# Patient Record
Sex: Female | Born: 1965 | Race: White | Hispanic: No | Marital: Married | Smoking: Never smoker
Health system: Southern US, Community
[De-identification: ages and names within clinical notes are randomized; demographics above are authoritative.]

---

## 2001-08-30 ENCOUNTER — Other Ambulatory Visit: Admission: RE | Admit: 2001-08-30 | Discharge: 2001-08-30 | Payer: Self-pay | Admitting: Obstetrics and Gynecology

## 2002-10-30 ENCOUNTER — Other Ambulatory Visit: Admission: RE | Admit: 2002-10-30 | Discharge: 2002-10-30 | Payer: Self-pay | Admitting: Family Medicine

## 2003-10-26 ENCOUNTER — Emergency Department (HOSPITAL_COMMUNITY): Admission: EM | Admit: 2003-10-26 | Discharge: 2003-10-26 | Payer: Self-pay | Admitting: Emergency Medicine

## 2003-11-15 ENCOUNTER — Other Ambulatory Visit: Admission: RE | Admit: 2003-11-15 | Discharge: 2003-11-15 | Payer: Self-pay | Admitting: Obstetrics and Gynecology

## 2004-01-02 ENCOUNTER — Ambulatory Visit (HOSPITAL_COMMUNITY): Admission: RE | Admit: 2004-01-02 | Discharge: 2004-01-02 | Payer: Self-pay | Admitting: Obstetrics and Gynecology

## 2004-04-08 ENCOUNTER — Ambulatory Visit (HOSPITAL_COMMUNITY): Admission: RE | Admit: 2004-04-08 | Discharge: 2004-04-08 | Payer: Self-pay | Admitting: Obstetrics and Gynecology

## 2004-06-12 ENCOUNTER — Inpatient Hospital Stay (HOSPITAL_COMMUNITY): Admission: AD | Admit: 2004-06-12 | Discharge: 2004-06-16 | Payer: Self-pay | Admitting: Obstetrics and Gynecology

## 2004-06-14 ENCOUNTER — Encounter (INDEPENDENT_AMBULATORY_CARE_PROVIDER_SITE_OTHER): Payer: Self-pay | Admitting: Specialist

## 2005-03-25 ENCOUNTER — Other Ambulatory Visit: Admission: RE | Admit: 2005-03-25 | Discharge: 2005-03-25 | Payer: Self-pay | Admitting: Obstetrics and Gynecology

## 2006-04-29 ENCOUNTER — Other Ambulatory Visit: Admission: RE | Admit: 2006-04-29 | Discharge: 2006-04-29 | Payer: Self-pay | Admitting: Obstetrics and Gynecology

## 2009-09-16 ENCOUNTER — Ambulatory Visit (HOSPITAL_COMMUNITY): Admission: RE | Admit: 2009-09-16 | Discharge: 2009-09-16 | Payer: Self-pay | Admitting: Obstetrics and Gynecology

## 2010-09-17 ENCOUNTER — Ambulatory Visit (HOSPITAL_COMMUNITY)
Admission: RE | Admit: 2010-09-17 | Discharge: 2010-09-17 | Payer: Self-pay | Source: Home / Self Care | Attending: Obstetrics and Gynecology | Admitting: Obstetrics and Gynecology

## 2011-02-19 NOTE — Op Note (Signed)
NAME:  Suzanne Pittman, Suzanne Pittman                          ACCOUNT NO.:  [id number]   MEDICAL RECORD NO.:  [id number]                   PATIENT TYPE:  OUT   LOCATION:  ULT                                  [facility]   PHYSICIAN:  Hal Morales, M.D.             DATE OF BIRTH:  [birth year]   DATE OF PROCEDURE:  01/02/2004  DATE OF DISCHARGE:                                 OPERATIVE REPORT   PREOPERATIVE DIAGNOSES:  Intrauterine pregnancy at 17 weeks' gestation,  maternal age 16.   POSTOPERATIVE DIAGNOSES:  Intrauterine pregnancy at 28 weeks' gestation,  maternal age 73.   OPERATION:  Genetic amniocentesis.   ANESTHESIA:  Local.   ESTIMATED BLOOD LOSS:  Less than 5 mL.   COMPLICATIONS:  None.   FINDINGS:  The fetal measurements were consistent with 16 weeks with a  normal anatomy scan as could be determined at 16 weeks.  The amniotic fluid  volume was normal.   PROCEDURE:  The patient was placed in the supine position on the ultrasound  table and an area of fluid which was clear of umbilical cord and fetus was  identified in the midline suprapubically.  This area was marked and the area  cleansed with multiple layers of Betadine and then draped as a sterile  field.  The area was infiltrated with 1% Xylocaine and a 22-gauge Ultravue  needle used to access the amniotic fluid pocket under ultrasound guidance; 5  cc of clear fluid was drawn into the first syringe and 10 cc into the second  syringe.  The amnio site was documented on ultrasound and the needle  removed.  The post amniocentesis heart rate was in the 150s.  The fluid was  then sent to [facility] for evaluation.  The patient is  known to be RH positive.  She was given written instructions for post  amniocentesis care.  She will follow up in two weeks in the office at which  time she will receive her results.                                               Hal Morales, M.D.    VPH/MEDQ  D:   01/02/2004  T:  01/03/2004  Job:  [id number]

## 2011-02-19 NOTE — H&P (Signed)
NAME:  Suzanne Pittman, Suzanne Pittman                          ACCOUNT NO.:  [id number]   MEDICAL RECORD NO.:  [id number]                   PATIENT TYPE:  MAT   LOCATION:  MATC                                 [facility]   PHYSICIAN:  Naima A. Dillard, M.D.              DATE OF BIRTH:  [birth year]   DATE OF ADMISSION:  06/12/2004  DATE OF DISCHARGE:                                HISTORY & PHYSICAL   BRIEF HISTORY:  Ms. Pogue is a 45 year old married white female gravida 1  para 0 at 39-4/7 weeks who was sent over from the office for evaluation  secondary to having 2+ protein on a voided specimen.  She denies any  headache, visual disturbances, leaking or bleeding.  She had no history of  elevated blood pressures in the past and at the office visit today her blood  pressure was 110/60.  She also denies any nausea or vomiting or right upper  quadrant pain.  She reports positive fetal movement.  She reports mild  cramping but no strong contractions.  Her pregnancy has been followed at  [facility] by the certified nurse midwife service laterally has  been essentially uncomplicated though at risk for (1) advanced maternal age  with a normal amnio, (2) history of hyperemesis with this pregnancy and (3)  history of depression requiring Zoloft throughout this pregnancy.   OBSTETRICAL-GYNECOLOGICAL HISTORY:  She is a gravida 1 para 0 with an LMP of  September 04, 2003 giving her an [facility] of June 10, 2004 and by ultrasound  June 15, 2004.  She had a history of abnormal Pap smear in 1995 with a  normal colposcopy.   GENERAL MEDICAL HISTORY:  She has no known drug allergies.  She reports  having had the usual childhood diseases.  She reports a history of  depression, has been on Zoloft with this pregnancy, and fractured her right  arm at age 89.   FAMILY HISTORY:  Paternal grandmother with chronic hypertension.  Father and  paternal grandfather with type 2 diabetes.  Paternal  grandfather with  prostate cancer.   GENETIC HISTORY:  Genetic history is significant only for the fact that she  was over age 33 but she did have a normal amnio.   SOCIAL HISTORY:  She is married to Cathe Mons who is involved and  supportive.  They are both employed as veterinarians and they deny any  religious affiliation that affects their care.   PRENATAL LABORATORIES:  Her blood type is A positive, her antibody screen is  negative, syphilis is nonreactive, rubella is positive, hepatitis B surface  antigen, HIV was declined, toxo titers were negative, GC and Chlamydia were  negative, Pap smear was within normal limits, her 1-hour glucola was 113,  and her 36 week beta strep was negative.   PHYSICAL EXAMINATION:  Her blood pressures are 140s-150s over mostly 80s-  90s.  She is  afebrile and her heart is regular rhythm and rate.  Her chest  is clear.  Her breasts are soft and nontender.  Her abdomen is gravid with  uterine contractions at every 2-5 minutes and mild.  Fetal heart rate is  very reactive and reassuring.  Her pelvic exam is 2 cm, 80%, vertex, at a -1  to 0 station.  Her extremities have 2+ edema, 2+ reflexes, and no clonus.   LABORATORIES:  Her cath urine showed 1+ protein, specific gravity of greater  than 1.030 and ketones of 15, her platelets are 151,000, hemoglobin 11.4,  hematocrit of 33.3, SGOT of 21, SGPT of 12, creatinine of 0.9, LDH of 154,  and uric acid of 6.9.   ASSESSMENT:  1.  Intrauterine pregnancy at 39-4/7 weeks.  2.  Pregnancy-induced hypertension versus preeclampsia.   PLAN:  Her plan per consult with Dr. Normand Sloop was as follows.  Dr. Normand Sloop  recommends induction of labor however, the patient and her husband hesitate  to proceed as they do not want to have interventions that may not be  necessary.  They both would like to have more time to continue to assess  blood pressures and the necessity of induction prior to being induced  however, if the  blood pressures continue to rise throughout the night they  understand that induction of labor will need to be expedited and they are  willing to proceed in that case, otherwise, they would like to wait until  the morning, have a repeat urine evaluated and if the proteinuria is  persistent then proceed with induction.  In the meantime, a 24-hour urine  will be collected.     Concha Pyo. Duplantis, C.N.M.              Naima A. Normand Sloop, M.D.    SJD/MEDQ  D:  06/12/2004  T:  06/12/2004  Job:  [id number]

## 2011-02-19 NOTE — Discharge Summary (Signed)
Suzanne Pittman, Suzanne Pittman                          ACCOUNT NO.:  [id number]   MEDICAL RECORD NO.:  [id number]                   PATIENT TYPE:  INP   LOCATION:  9106                                 [facility]ILITY:  [facility]   PHYSICIAN:  Janine Limbo, M.D.            DATE OF BIRTH:  1965-11-03   DATE OF ADMISSION:  06/12/2004  DATE OF DISCHARGE:  06/16/2004                                 DISCHARGE SUMMARY   ADMITTING DIAGNOSES:  1.  Intrauterine pregnancy at term.  2.  Mild preeclampsia.   DISCHARGE DIAGNOSES:  1.  Intrauterine pregnancy at term.  2.  Pregnancy-induced hypertension.  3.  Retained placenta.  4.  Postpartum anemia.   PROCEDURES:  1.  Vacuum-assisted vaginal birth.  2.  Manual removal of placenta.   HOSPITAL COURSE:  Ms. Ruehl is a 45 year old gravida 1 para 0 at 39-4/7  weeks who was admitted on 06/12/04 with proteinuria on a voided and cathetered  specimen.  Pressure at that time was 110/60.  Pregnancy had been remarkable  for:  1. Advanced maternal age with normal amniocentesis.  2. History of  hyperemesis with pregnancy.  3. History of depression requiring Zoloft.  On  admission, cervix was 2, 80% vertex, -1 to 0 station.  Uric acid was 6.9,  otherwise PIH labs were within normal limits.  Induction of labor was  recommended in light of the elevated blood pressures in the 80-90s diastolic  and the 1+ protein on a cathetered specimen; however, the patient and  husband elected to observe overnight.  Pressures still were fairly labile  during the night.  Negative protein was repeated on a UA the next morning.  Further discussion was held with the patient and her husband.  The decision  was made to artificially rupture membranes with clear fluid noted for  initial measure of induction.  Labor progressed along.  The epidural was  placed.  Blood pressures remained somewhat labile.  She progressed to  completely dilated at approximately 7:50 p.m. on the evening of 06/13/04  with  a vertex of +1 station.  She also began to push at approximately 9:40.  She  pushed for approximately 1 hour to an hour and a half with onset of some  deep variables.  In light of the maternal exhaustion, the patient requested  assistance with the vacuum assistant vacuum extractor.  Dr. Normand Sloop was  consulted and she came and placed the Mityvac.  With one pull, the infant  was delivered.  There was no nuchal cord noted.  It was a viable female,  weight 6 pounds, 10 ounces, Apgars were 8 and 9.  Placenta remained  retained.  The patient given subcutaneous terbutaline with attempted manual  removal by Dr. Normand Sloop of the placenta without success.  The patient was  consented for D&E.  She was taken to the OR where sublingual nitroglycerin  was given.  This removed without difficulty.  Bedside ultrasound indicated  no retained products.  The patient tolerated the procedure well.  On  postpartum day #1, her hemoglobin was 7.5 down from 11.5.  White blood cell  count was 20.6 up from 8.8 and platelets were 136 down from 151.  She had  received one dose of Ancef in the recovery room.  She was very tired but was  having no syncope.  Breast-feeding was going well.  CBC was repeated on the  morning of 06/16/04 with hemoglobin of 6.8, white blood cell count of 16.1,  and platelet count of 147.  The patient was out ad lib without difficulty  and without syncope.  She declined transfusion.  She was begun on OTC iron  supplementation.  She was deemed to have received the full benefit of her  hospital stay and was discharged home.   DISCHARGE INSTRUCTIONS:  Per [facility] handout.   DISCHARGE MEDICATIONS:  1.  Motrin 600 mg p.o. q.6h. p.r.n.  2.  Tylox 1-2 p.o. q.3-4h. p.r.n. pain.  3.  The patient will take ferrous sulfate 324 mg one p.o. b.i.d.  4.  Micronor one p.o. daily.   DISCHARGE FOLLOWUP:  Will occur in 4-6 weeks at [facility]  Preeclampsia and PIH precautions were  discussed with the patient.  She will  follow up with [place] on an as-needed basis.   DISCHARGE CONDITION:  Stable.     Renaldo Reel Emilee Hero, C.N.M.                   Janine Limbo, M.D.    VLL/MEDQ  D:  06/16/2004  T:  06/16/2004  Job:  [id number]

## 2011-02-19 NOTE — Op Note (Signed)
NAME:  Suzanne Pittman, Suzanne Pittman                          ACCOUNT NO.:  [id number]   MEDICAL RECORD NO.:  [id number]                   PATIENT TYPE:  INP   LOCATION:  9106                                 [facility]   PHYSICIAN:  Naima A. Dillard, M.D.              DATE OF BIRTH:  1966-06-22   DATE OF PROCEDURE:  06/14/2004  DATE OF DISCHARGE:                                 OPERATIVE REPORT   DELIVERY NOTE:  I was called to see the patient secondary to moderate  variables to 60s to 80s, lasting for about 1 minute with good recovery.  Patient had positive maternal nausea.  The patient was then examined and  found to be +3 station and in the right occiput-anterior position.  Patient  and husband were counseled about vacuum-assisted vaginal delivery versus  cesarean section, told the risks of vacuum-assisted vaginal delivery to be,  but not limited to, intraventricular hemorrhage, cephalohematoma, scalp  abrasion, bleeding and infection, told the risks of C-section are, but not  limited to, bleeding, infection, damage to internal organs, endometrial  blood vessels.  Patient and husband choose to have a vacuum-assisted vaginal  delivery.  The patient was also given the option to continue pushing so any  help, but she said that she was definitely exhausted, so the Mityvac was  placed in correct position and with 1 pull and no pop-off at 500 mmHg, the  head delivered to perineum.  The vacuum was on the head for about 10-20  seconds.  Vacuum was removed.  There was no nuchal cord.  Body was delivered  without difficulty with Apgars of 8 and 9 and a weight of 6 pounds 10  ounces.  Placenta was retrained for 30 minutes.  The patient was given subcu  terbutaline and I attempted a manual removal of placenta without success.  The patient was counseled for a D&E.  She was told the risks were, but not  limited to, bleeding, infection, __________, hysterectomy and __________  increta, all reviewed with the  patient and patient consented for D&E and  removal in the OR.                                               Naima A. Normand Sloop, M.D.    NAD/MEDQ  D:  06/14/2004  T:  06/14/2004  Job:  [id number]

## 2011-02-19 NOTE — Op Note (Signed)
NAME:  Suzanne Pittman, Suzanne Pittman                          ACCOUNT NO.:  [id number]   MEDICAL RECORD NO.:  [id number]                   PATIENT TYPE:  INP   LOCATION:  9106                                 [facility]   PHYSICIAN:  Naima A. Dillard, M.D.              DATE OF BIRTH:  Feb 23, 1966   DATE OF PROCEDURE:  06/14/2004  DATE OF DISCHARGE:                                 OPERATIVE REPORT   PREOPERATIVE DIAGNOSIS:  Status post vaginal delivery with pregnancy-induced  hypertension with retained placenta.   POSTOPERATIVE DIAGNOSIS:  Status post vaginal delivery with pregnancy-  induced hypertension with retained placenta.   PROCEDURE:  Manual removal of placenta.   ANESTHESIA:  Epidural.   INTRAVENOUS FLUIDS:  2000 mL crystalloid.   ESTIMATED BLOOD LOSS:  400 mL.   COMPLICATIONS:  None.   FINDINGS:  Placenta was removed intact after sublingual nitroglycerin was  given at 400 mcg.  In each spray, the patient received six sprays from  anesthesia.  Her uterus was then found to relax and also after given another  epidural injection she was found to relax.  I was able to reach all the way  around the placenta and remove it intact.  There was an ultrasonographer at  the bedside and we noted that there was no retained placenta after removal.  The patient did not need a curette.  All instruments were removed from the  vagina.  Sponge, lap, and needle counts were correct x2.  The patient went  to recovery room in stable condition.                                               Naima A. Normand Sloop, M.D.    NAD/MEDQ  D:  06/14/2004  T:  06/14/2004  Job:  [id number]

## 2011-08-10 ENCOUNTER — Other Ambulatory Visit (HOSPITAL_COMMUNITY): Payer: Self-pay | Admitting: Obstetrics and Gynecology

## 2011-08-10 DIAGNOSIS — Z1231 Encounter for screening mammogram for malignant neoplasm of breast: Secondary | ICD-10-CM

## 2011-09-20 ENCOUNTER — Ambulatory Visit (HOSPITAL_COMMUNITY)
Admission: RE | Admit: 2011-09-20 | Discharge: 2011-09-20 | Disposition: A | Discharge status: Home / Self Care | Payer: PRIVATE HEALTH INSURANCE | Source: Ambulatory Visit | Attending: Obstetrics and Gynecology | Admitting: Obstetrics and Gynecology

## 2011-09-20 DIAGNOSIS — Z1231 Encounter for screening mammogram for malignant neoplasm of breast: Secondary | ICD-10-CM | POA: Insufficient documentation

## 2011-12-21 ENCOUNTER — Encounter: Payer: Self-pay | Admitting: Obstetrics and Gynecology

## 2011-12-23 ENCOUNTER — Encounter (INDEPENDENT_AMBULATORY_CARE_PROVIDER_SITE_OTHER): Payer: PRIVATE HEALTH INSURANCE | Admitting: Obstetrics and Gynecology

## 2011-12-23 DIAGNOSIS — Z30431 Encounter for routine checking of intrauterine contraceptive device: Secondary | ICD-10-CM

## 2012-08-17 ENCOUNTER — Ambulatory Visit: Payer: PRIVATE HEALTH INSURANCE | Admitting: Obstetrics and Gynecology

## 2012-09-12 ENCOUNTER — Ambulatory Visit (INDEPENDENT_AMBULATORY_CARE_PROVIDER_SITE_OTHER): Payer: 59 | Admitting: Obstetrics and Gynecology

## 2012-09-12 ENCOUNTER — Encounter: Payer: Self-pay | Admitting: Obstetrics and Gynecology

## 2012-09-12 VITALS — BP 104/58 | Ht 68.0 in | Wt 149.0 lb

## 2012-09-12 DIAGNOSIS — Z01419 Encounter for gynecological examination (general) (routine) without abnormal findings: Secondary | ICD-10-CM

## 2012-09-12 DIAGNOSIS — F3289 Other specified depressive episodes: Secondary | ICD-10-CM

## 2012-09-12 DIAGNOSIS — F329 Major depressive disorder, single episode, unspecified: Secondary | ICD-10-CM

## 2012-09-12 DIAGNOSIS — Z975 Presence of (intrauterine) contraceptive device: Secondary | ICD-10-CM

## 2012-09-12 NOTE — Progress Notes (Signed)
Subjective:    Suzanne Pittman is a 46 y.o. female, No obstetric history on file., who presents for an annual exam.   Patient reports:  Doing well, with new IUD placed 1/13.  In 3-4 months after that, had recurrent yeast sx, but now resolved.  No cycles on Mirena, but has some sense of ovulation.    History   Social History  . Marital Status: Married    Spouse Name: N/A    Number of Children: N/A  . Years of Education: N/A   Social History Main Topics  . Smoking status: Never Smoker   . Smokeless tobacco: Never Used  . Alcohol Use: 0.0 oz/week    1-2 Glasses of wine per week  . Drug Use: No  . Sexually Active: Yes    Birth Control/ Protection: IUD     Comment: Mirena   Other Topics Concern  . None   Social History Narrative  . None    Menstrual cycle:   LMP: No LMP recorded. Patient is not currently having periods (Reason: IUD).           Cycle: None on IUD  The following portions of the patient's history were reviewed and updated as appropriate: allergies, current medications, past family history, past medical history, past social history, past surgical history and problem list.  Review of Systems Pertinent items are noted in HPI. Breast:Negative for breast lump,nipple discharge or nipple retraction Gastrointestinal: Negative for abdominal pain, change in bowel habits or rectal bleeding Urinary:negative   Objective:    BP 104/58  Ht 5\' 8"  (1.727 m)  Wt 149 lb (67.586 kg)  BMI 22.66 kg/m2    Weight:  Wt Readings from Last 1 Encounters:  09/12/12 149 lb (67.586 kg)          BMI: Body mass index is 22.66 kg/(m^2).  General Appearance: Alert, appropriate appearance for age. No acute distress HEENT: Grossly normal Neck / Thyroid: Supple, no masses, nodes or enlargement Lungs: clear to auscultation bilaterally Back: No CVA tenderness Breast Exam: No masses or nodes.No dimpling, nipple retraction or discharge.  Breast tissue is dense and glandular on  exam. Cardiovascular: Regular rate and rhythm. S1, S2, no murmur Gastrointestinal: Soft, non-tender, no masses or organomegaly Pelvic Exam: Vulva and vagina appear normal. Bimanual exam reveals normal uterus and adnexa.  IUD string visible at introitus Rectovaginal: normal rectal, no masses Lymphatic Exam: Non-palpable nodes in neck, clavicular, axillary, or inguinal regions  Skin: no rash or abnormalities Neurologic: Normal gait and speech, no tremor  Psychiatric: Alert and oriented, appropriate affect.   Wet Prep:not applicable Urinalysis:not applicable UPT: Not done   Assessment:    Normal gyn exam  IUD in place since 1/13 (previous user)   Plan:    Mammogram:  Plans in January Pap:  Done today STD screening: declined Contraception:IUD Other:  NA      Suzanne Pittman, [place], [place]

## 2012-09-12 NOTE — Progress Notes (Signed)
Regular Periods: no IUD  Mammogram: yes  Monthly Breast Ex.: no Exercise: yes  Tetanus < 10 years: no Seatbelts: yes  NI. Bladder Functn.: yes Abuse at home: no  Daily BM's: yes Stressful Work: no  Healthy Diet: yes Sigmoid-Colonoscopy: never had one   Calcium: yes Medical problems this year: none   LAST PAP:08/2011  Contraception: IUD   Mammogram:  Yes   PCP: C.  Booth PA   PMH: unchanged  FMH: unchanged

## 2012-09-13 LAB — PAP IG W/ RFLX HPV ASCU

## 2012-09-29 ENCOUNTER — Other Ambulatory Visit: Payer: Self-pay | Admitting: Obstetrics and Gynecology

## 2012-09-29 DIAGNOSIS — Z1231 Encounter for screening mammogram for malignant neoplasm of breast: Secondary | ICD-10-CM

## 2012-10-17 ENCOUNTER — Ambulatory Visit (HOSPITAL_COMMUNITY)
Admission: RE | Admit: 2012-10-17 | Discharge: 2012-10-17 | Disposition: A | Discharge status: Home / Self Care | Payer: 59 | Source: Ambulatory Visit | Attending: Obstetrics and Gynecology | Admitting: Obstetrics and Gynecology

## 2012-10-17 DIAGNOSIS — Z1231 Encounter for screening mammogram for malignant neoplasm of breast: Secondary | ICD-10-CM

## 2013-10-15 ENCOUNTER — Other Ambulatory Visit: Payer: Self-pay | Admitting: Obstetrics and Gynecology

## 2013-10-15 DIAGNOSIS — Z1231 Encounter for screening mammogram for malignant neoplasm of breast: Secondary | ICD-10-CM

## 2013-10-26 ENCOUNTER — Ambulatory Visit (HOSPITAL_COMMUNITY): Payer: 59

## 2013-11-01 ENCOUNTER — Ambulatory Visit (HOSPITAL_COMMUNITY)
Admission: RE | Admit: 2013-11-01 | Discharge: 2013-11-01 | Disposition: A | Discharge status: Home / Self Care | Payer: 59 | Source: Ambulatory Visit | Attending: Obstetrics and Gynecology | Admitting: Obstetrics and Gynecology

## 2013-11-01 DIAGNOSIS — Z1231 Encounter for screening mammogram for malignant neoplasm of breast: Secondary | ICD-10-CM | POA: Insufficient documentation

## 2014-11-06 ENCOUNTER — Other Ambulatory Visit (HOSPITAL_COMMUNITY): Payer: Self-pay | Admitting: Obstetrics and Gynecology

## 2014-11-06 DIAGNOSIS — Z1231 Encounter for screening mammogram for malignant neoplasm of breast: Secondary | ICD-10-CM

## 2014-11-12 ENCOUNTER — Ambulatory Visit (HOSPITAL_COMMUNITY)
Admission: RE | Admit: 2014-11-12 | Discharge: 2014-11-12 | Disposition: A | Discharge status: Home / Self Care | Payer: BLUE CROSS/BLUE SHIELD | Source: Ambulatory Visit | Attending: Obstetrics and Gynecology | Admitting: Obstetrics and Gynecology

## 2014-11-12 DIAGNOSIS — Z1231 Encounter for screening mammogram for malignant neoplasm of breast: Secondary | ICD-10-CM | POA: Diagnosis not present

## 2015-06-21 IMAGING — MG MM SCREENING BREAST TOMO BILAT
8 series · 8 of 24 positions shown · non-contrast
Comparison: Previous exam(s).

CLINICAL DATA: Screening.

EXAM:
DIGITAL SCREENING BILATERAL MAMMOGRAM WITH 3D TOMO WITH CAD

[L MLO]
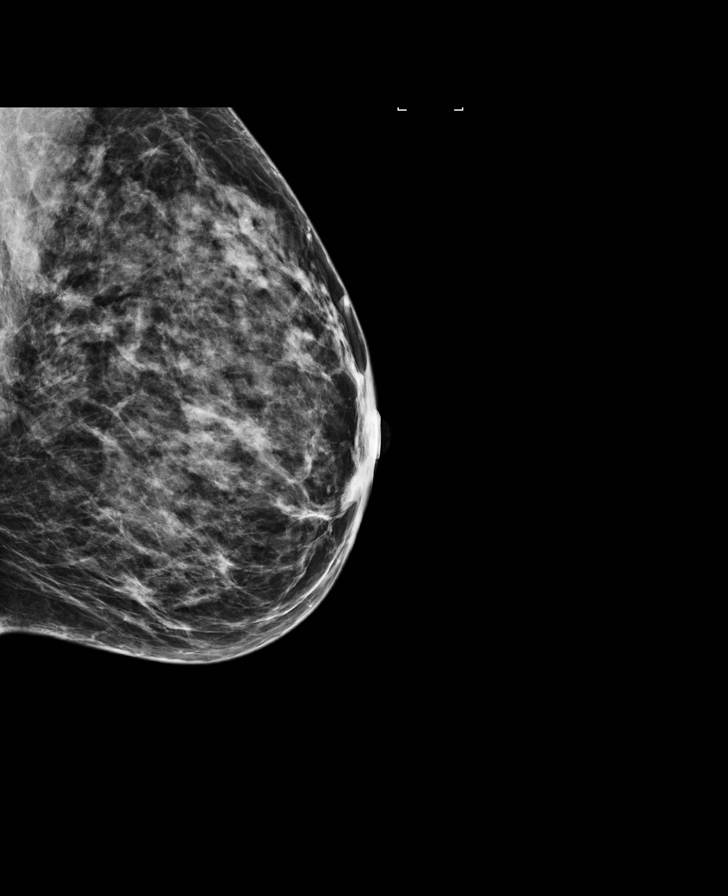

[R CC]
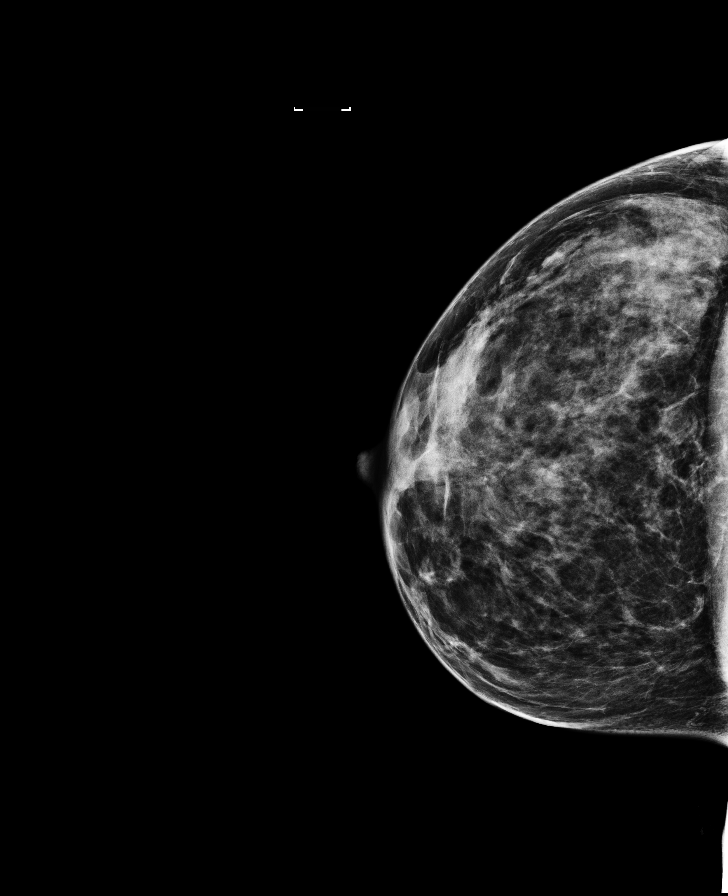

[L CC]
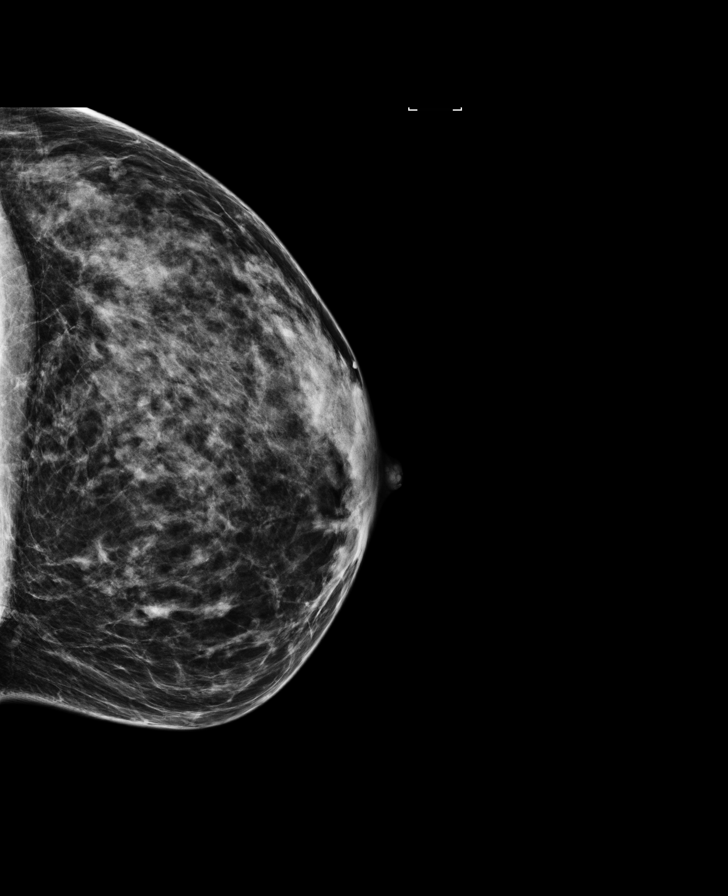

[R MLO]
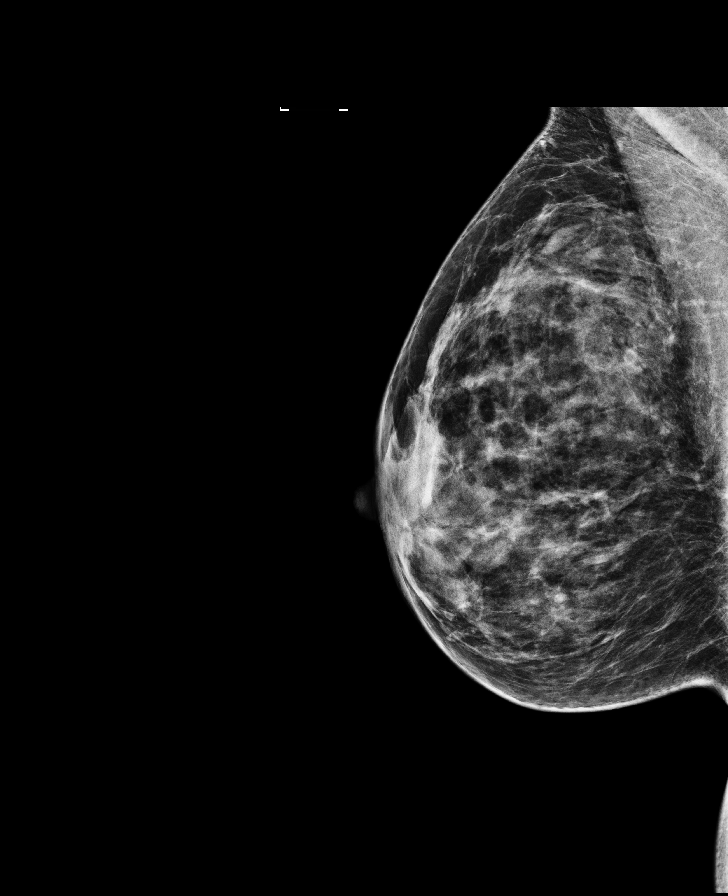

[R CC tomo · tomo slice 33/66.0]
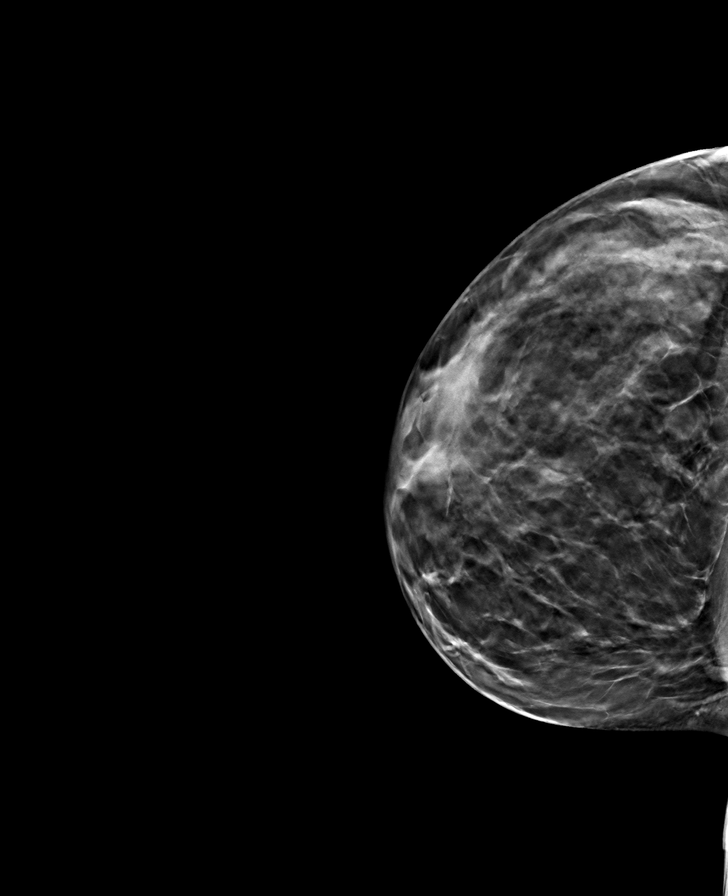

[L CC tomo · tomo slice 35/68.0]
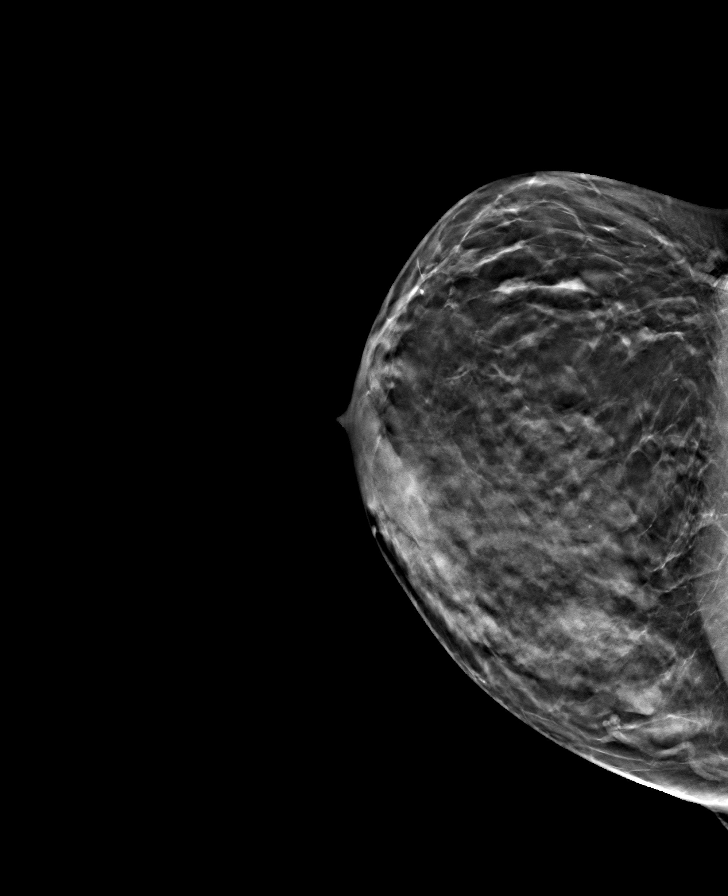

[L MLO tomo · tomo slice 33/66.0]
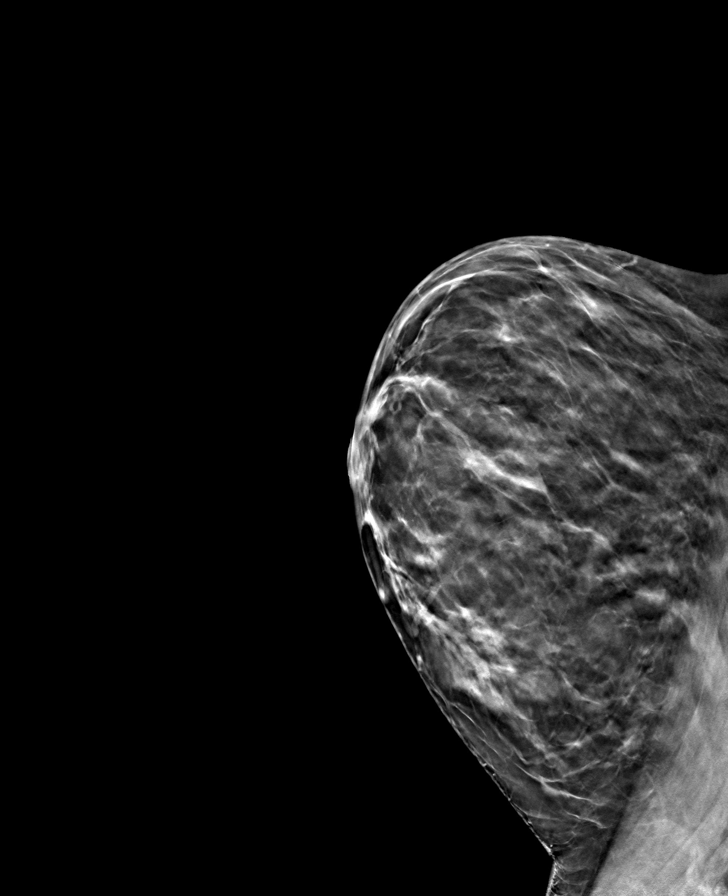

[R MLO tomo · tomo slice 35/69.0]
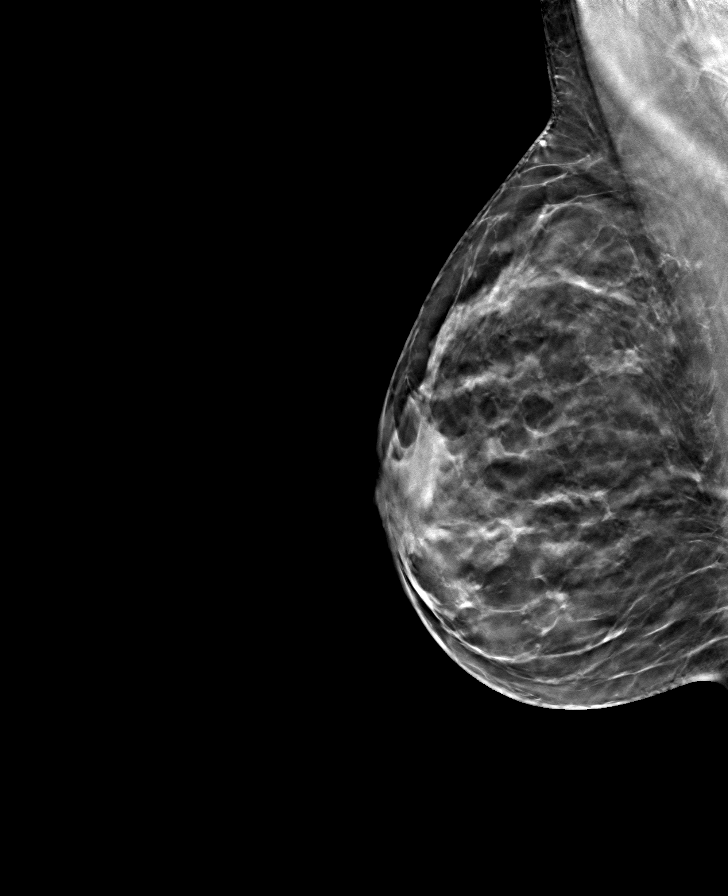

[8 of 24 positions shown; findings below may reference images not displayed]

ACR Breast Density Category c: The breast tissue is heterogeneously
dense, which may obscure small masses.
FINDINGS: There are no findings suspicious for malignancy. Images were
processed with CAD.
IMPRESSION: No mammographic evidence of malignancy. A result letter of this
screening mammogram will be mailed directly to the patient.

RECOMMENDATION:
Screening mammogram in one year. (Code:OA-G-1SS)

BI-RADS CATEGORY  1: Negative.

## 2021-06-21 ENCOUNTER — Other Ambulatory Visit: Payer: Self-pay

## 2021-06-21 ENCOUNTER — Emergency Department (HOSPITAL_BASED_OUTPATIENT_CLINIC_OR_DEPARTMENT_OTHER)
Admission: EM | Admit: 2021-06-21 | Discharge: 2021-06-21 | Disposition: A | Discharge status: Home / Self Care | Payer: BC Managed Care – PPO | Attending: Emergency Medicine | Admitting: Emergency Medicine

## 2021-06-21 ENCOUNTER — Emergency Department (HOSPITAL_BASED_OUTPATIENT_CLINIC_OR_DEPARTMENT_OTHER): Payer: BC Managed Care – PPO

## 2021-06-21 DIAGNOSIS — S52571A Other intraarticular fracture of lower end of right radius, initial encounter for closed fracture: Secondary | ICD-10-CM | POA: Insufficient documentation

## 2021-06-21 DIAGNOSIS — Y9389 Activity, other specified: Secondary | ICD-10-CM | POA: Diagnosis not present

## 2021-06-21 DIAGNOSIS — W11XXXA Fall on and from ladder, initial encounter: Secondary | ICD-10-CM | POA: Diagnosis not present

## 2021-06-21 DIAGNOSIS — S6991XA Unspecified injury of right wrist, hand and finger(s), initial encounter: Secondary | ICD-10-CM | POA: Diagnosis present

## 2021-06-21 MED ORDER — NAPROXEN 375 MG PO TABS: 375.0000 mg | ORAL_TABLET | Freq: Two times a day (BID) | ORAL | 0 refills | Status: AC

## 2021-06-21 NOTE — Discharge Instructions (Addendum)
Imaging done today reveals that you have fractured the end of your radius.  We placed your arm in a splint today for stabilization of this fracture.  You will need to follow-up with orthopedics/hand for further management of this injury.  In the meantime rest, ice, compress, and elevate as you are able.  Manage pain with OTC Tylenol and naproxen.  Do not take naproxen and ibuprofen together.

## 2021-06-21 NOTE — ED Triage Notes (Signed)
Fell stepping backwards off a step stool. Injury to right forearm With swelling and deformity  Denies pain or injury to any other area

## 2021-06-21 NOTE — ED Provider Notes (Signed)
[facility] EMERGENCY DEPARTMENT Provider Note   CSN: [id number] Arrival date & time: 06/21/21  1510     History Chief Complaint  Patient presents with   Suzanne Pittman is a 55 y.o. female.  Patient presents today with fall from a step stool.  States she was painting earlier today slipped off and landed on her right wrist.  Denies hitting her head or loss of consciousness. She is not anticoagulated. She is right-handed.  Denies numbness or tingling in the wrist.  Denies other injuries.  The history is provided by the patient. No language interpreter was used.  Fall      No past medical history on file.  Patient Active Problem List   Diagnosis Date Noted   IUD (intrauterine device) in place 09/12/2012   Hx Depression 09/12/2012    No past surgical history on file.   OB History   No obstetric history on file.     Family History  Problem Relation Age of Onset   Diabetes Father    Alcohol abuse Father    Prostate cancer Maternal Grandfather    Hypertension Paternal Grandmother    Heart disease Paternal Grandmother    Alcohol abuse Paternal Grandfather     Social History   Tobacco Use   Smoking status: Never   Smokeless tobacco: Never  Substance Use Topics   Alcohol use: Yes    Alcohol/week: 1.0 - 2.0 standard drink    Types: 1 - 2 Glasses of wine per week   Drug use: No    Home Medications Prior to Admission medications   Medication Sig Start Date End Date Taking? Authorizing Provider  calcium carbonate (OS-CAL) 600 MG TABS Take 600 mg by mouth 2 (two) times daily with a meal.    [provider]  cholecalciferol (VITAMIN D) 1000 UNITS tablet Take 1,000 Units by mouth daily.    [provider]  fish oil-omega-3 fatty acids 1000 MG capsule Take 2 g by mouth daily.    [provider]    Allergies    Patient has no known allergies.  Review of Systems   Review of Systems  Constitutional:  Negative for chills  and fever.  Gastrointestinal:  Negative for nausea and vomiting.  Musculoskeletal:  Positive for arthralgias, joint swelling and myalgias. Negative for back pain, gait problem, neck pain and neck stiffness.  Skin:  Negative for color change and rash.  Neurological:  Negative for syncope, weakness and numbness.  Psychiatric/Behavioral:  Negative for confusion and decreased concentration.   All other systems reviewed and are negative.  Physical Exam Updated Vital Signs BP (!) 132/95 (BP Location: Right Arm)   Pulse 81   Temp 98.6 F (37 C) (Oral)   Resp 18   Ht 5\' 7"  (1.702 m)   Wt 72.6 kg   SpO2 100%   BMI 25.06 kg/m   Physical Exam Vitals and nursing note reviewed.  Constitutional:      General: She is not in acute distress.    Appearance: Normal appearance. She is normal weight. She is not ill-appearing, toxic-appearing or diaphoretic.  HENT:     Head: Normocephalic and atraumatic.  Cardiovascular:     Rate and Rhythm: Normal rate.  Pulmonary:     Effort: Pulmonary effort is normal. No respiratory distress.  Musculoskeletal:        General: Swelling, tenderness, deformity and signs of injury present.     Cervical back: Normal range of  motion and neck supple. No tenderness.     Comments: Significantly impaired range of motion to affected right wrist.  Pain with flexion, extension, and rotation.  Full range of motion of distal fingers.  Capillary refill less than 2 seconds in all distal fingers.  No numbness or tingling.  Significant bruising noted to the distal radius.  Skin:    General: Skin is warm and dry.     Capillary Refill: Capillary refill takes less than 2 seconds.  Neurological:     General: No focal deficit present.     Mental Status: She is alert.  Psychiatric:        Mood and Affect: Mood normal.        Behavior: Behavior normal.    ED Results / Procedures / Treatments   Labs (all labs ordered are listed, but only abnormal results are displayed) Labs  Reviewed - No data to display  EKG None  Radiology DG Forearm Right  Result Date: 06/21/2021 CLINICAL DATA:  Acute RIGHT forearm pain following fall today. Initial encounter. EXAM: RIGHT FOREARM - 2 VIEW COMPARISON:  None. FINDINGS: A nondisplaced intra-articular distal radial fracture is noted with horizontal and vertical components. No dislocation or subluxation identified. No other fractures are noted. IMPRESSION: Nondisplaced intra-articular distal radial fracture. Electronically Signed   By: Harmon Pier M.D.   On: 06/21/2021 16:30    Procedures .Ortho Injury Treatment  Date/Time: 06/21/2021 6:13 PM Performed by: Silva Bandy, PA-C Authorized by: Silva Bandy, PA-C   Consent:    Consent obtained:  Verbal   Consent given by:  Patient   Risks discussed:  Restricted joint movement and stiffness   Alternatives discussed:  No treatmentInjury location: wrist Location details: right wrist Injury type: fracture Fracture type: distal radius Pre-procedure neurovascular assessment: neurovascularly intact Pre-procedure distal perfusion: normal Pre-procedure neurological function: normal Pre-procedure range of motion: reduced  Anesthesia: Local anesthesia used: no  Patient sedated: NoManipulation performed: no Immobilization: splint and sling Splint type: sugar tong Splint Applied by: ED Tech Supplies used: plaster and cotton padding Post-procedure neurovascular assessment: post-procedure neurovascularly intact Post-procedure distal perfusion: normal Post-procedure neurological function: normal Post-procedure range of motion: unchanged     Medications Ordered in ED Medications - No data to display  ED Course  I have reviewed the triage vital signs and the nursing notes.  Pertinent labs & imaging results that were available during my care of the patient were reviewed by me and considered in my medical decision making (see chart for details).    MDM  Rules/Calculators/A&P                         Patient presents with right wrist pain following a fall. Patient X-Ray shows nondisplaced intra-articular distal radius fracture.  Patient is neurovascularly intact in distal right wrist and hand.  Pain managed in ED. Patient placed in a sugar-tong splint with a sling.  Distal perfusion intact following splinting.  Will refer patient for follow-up with hand for further management.  Patient will be dc home with pain management & is agreeable with above plan.  She is given instructions of red flag symptoms that would prompt return.  Findings and plan of care discussed with supervising physician Dr. Anitra Lauth who is in agreement.     Final Clinical Impression(s) / ED Diagnoses Final diagnoses:  Other closed intra-articular fracture of distal end of right radius, initial encounter    Rx / DC Orders ED Discharge  Orders          Ordered    naproxen (NAPROSYN) 375 MG tablet  2 times daily        06/21/21 1817          An After Visit Summary was printed and given to the patient.    Vear Clock 06/21/21 Suzanne Palma, MD 07/17/21 [phone]

## 2022-01-28 IMAGING — DX DG FOREARM 2V*R*
2 series · 2 of 2 positions shown · non-contrast
Comparison: None.

CLINICAL DATA: Acute RIGHT forearm pain following fall today.
Initial encounter.

EXAM:
RIGHT FOREARM - 2 VIEW

[forearm ap]
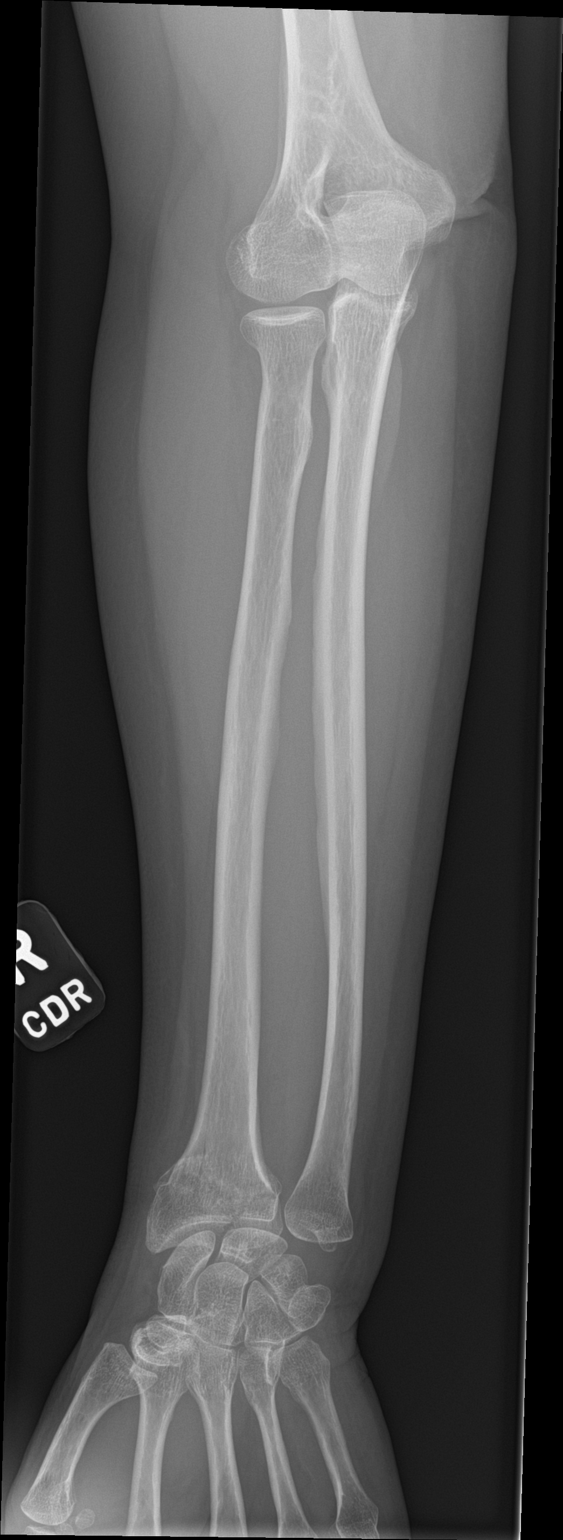

[forearm lat]
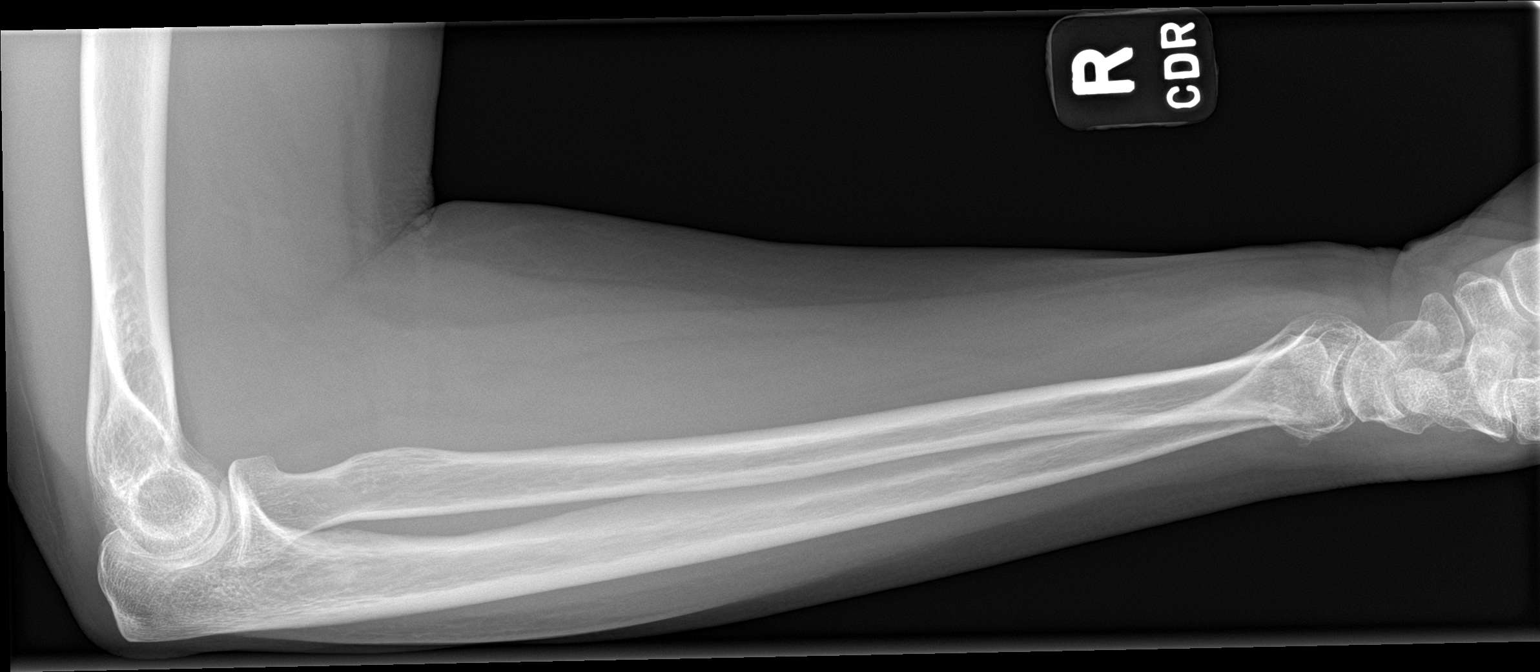

[2 of 2 positions shown; findings below may reference images not displayed]

FINDINGS: A nondisplaced intra-articular distal radial fracture is noted with
horizontal and vertical components.

No dislocation or subluxation identified.

No other fractures are noted.
IMPRESSION: Nondisplaced intra-articular distal radial fracture.
